# Patient Record
Sex: Female | Born: 1990 | Race: Black or African American | Hispanic: No | Marital: Single | Smoking: Never smoker
Health system: Southern US, Community
[De-identification: ages and names within clinical notes are randomized; demographics above are authoritative.]

## PROBLEM LIST (undated history)

## (undated) DIAGNOSIS — O00109 Unspecified tubal pregnancy without intrauterine pregnancy: Secondary | ICD-10-CM

## (undated) HISTORY — PX: DILATION AND CURETTAGE OF UTERUS: SHX78

## (undated) HISTORY — PX: SALPINGECTOMY: SHX328

---

## 2011-09-16 ENCOUNTER — Emergency Department (HOSPITAL_COMMUNITY)
Admission: EM | Admit: 2011-09-16 | Discharge: 2011-09-16 | Disposition: A | Discharge status: Home / Self Care | Payer: 59 | Attending: Emergency Medicine | Admitting: Emergency Medicine

## 2011-09-16 DIAGNOSIS — K089 Disorder of teeth and supporting structures, unspecified: Secondary | ICD-10-CM | POA: Insufficient documentation

## 2012-01-25 ENCOUNTER — Emergency Department (HOSPITAL_COMMUNITY): Payer: Medicaid Other

## 2012-01-25 ENCOUNTER — Other Ambulatory Visit (HOSPITAL_COMMUNITY): Payer: PRIVATE HEALTH INSURANCE

## 2012-01-25 ENCOUNTER — Observation Stay (HOSPITAL_COMMUNITY)
Admission: EM | Admit: 2012-01-25 | Discharge: 2012-01-27 | Disposition: A | Discharge status: Home / Self Care | Payer: Medicaid Other | Attending: Obstetrics and Gynecology | Admitting: Obstetrics and Gynecology

## 2012-01-25 ENCOUNTER — Encounter (HOSPITAL_COMMUNITY): Payer: Self-pay | Admitting: Emergency Medicine

## 2012-01-25 DIAGNOSIS — R1031 Right lower quadrant pain: Secondary | ICD-10-CM | POA: Insufficient documentation

## 2012-01-25 DIAGNOSIS — O00109 Unspecified tubal pregnancy without intrauterine pregnancy: Principal | ICD-10-CM | POA: Insufficient documentation

## 2012-01-25 LAB — ABO/RH: ABO/RH(D): O POS

## 2012-01-25 LAB — URINE MICROSCOPIC-ADD ON

## 2012-01-25 LAB — CBC
HCT: 34.7 % — ABNORMAL LOW (ref 36.0–46.0)
MCH: 30.2 pg (ref 26.0–34.0)
MCHC: 32.9 g/dL (ref 30.0–36.0)
MCV: 92 fL (ref 78.0–100.0)
RDW: 12.1 % (ref 11.5–15.5)

## 2012-01-25 LAB — POCT I-STAT, CHEM 8
BUN: 5 mg/dL — ABNORMAL LOW (ref 6–23)
Calcium, Ion: 1.1 mmol/L — ABNORMAL LOW (ref 1.12–1.32)
Creatinine, Ser: 0.8 mg/dL (ref 0.50–1.10)
Hemoglobin: 15 g/dL (ref 12.0–15.0)
Sodium: 139 mEq/L (ref 135–145)
TCO2: 23 mmol/L (ref 0–100)

## 2012-01-25 LAB — DIFFERENTIAL
Basophils Absolute: 0 10*3/uL (ref 0.0–0.1)
Basophils Relative: 0 % (ref 0–1)
Eosinophils Absolute: 0 10*3/uL (ref 0.0–0.7)
Eosinophils Relative: 0 % (ref 0–5)
Monocytes Absolute: 0.8 10*3/uL (ref 0.1–1.0)
Monocytes Relative: 7 % (ref 3–12)

## 2012-01-25 LAB — WET PREP, GENITAL

## 2012-01-25 LAB — URINALYSIS, ROUTINE W REFLEX MICROSCOPIC
Bilirubin Urine: NEGATIVE
Ketones, ur: 40 mg/dL — AB
Nitrite: NEGATIVE
Urobilinogen, UA: 1 mg/dL (ref 0.0–1.0)

## 2012-01-25 MED ORDER — ONDANSETRON HCL 4 MG/2ML IJ SOLN
4.0000 mg | Freq: Once | INTRAMUSCULAR | Status: AC
  Administered 2012-01-25: 4 mg via INTRAVENOUS
  Filled 2012-01-25: qty 2

## 2012-01-25 MED ORDER — SODIUM CHLORIDE 0.9 % IV SOLN
INTRAVENOUS | Status: DC
  Administered 2012-01-25: 23:00:00 via INTRAVENOUS

## 2012-01-25 MED ORDER — SODIUM CHLORIDE 0.9 % IV BOLUS (SEPSIS)
500.0000 mL | Freq: Once | INTRAVENOUS | Status: AC
  Administered 2012-01-25: 500 mL via INTRAVENOUS

## 2012-01-25 MED ORDER — MORPHINE SULFATE 4 MG/ML IJ SOLN
4.0000 mg | Freq: Once | INTRAMUSCULAR | Status: AC
  Administered 2012-01-25: 4 mg via INTRAVENOUS
  Filled 2012-01-25: qty 1

## 2012-01-25 NOTE — ED Provider Notes (Signed)
History     CSN: [id number]  Arrival date & time 01/25/12  1850   First MD Initiated Contact with Patient 01/25/12 1917      Chief Complaint  Patient presents with  . Abdominal Pain    (Consider location/radiation/quality/duration/timing/severity/associated sxs/prior treatment) Patient is a 21 y.o. female presenting with abdominal pain. The history is provided by the patient.  Abdominal Pain The primary symptoms of the illness include abdominal pain and vaginal bleeding. The primary symptoms of the illness do not include fatigue or shortness of breath.  Symptoms associated with the illness do not include back pain.   patient states that earlier in the month she had her condom break and took Plan B. She then began her period on the 16th when she was 3. It was initially burgendy then become more red. She's now having increasing abdominal pain and cramping. She states that she usually has cramping with her periods, nonpayment this.  History reviewed. No pertinent past medical history.  History reviewed. No pertinent past surgical history.  History reviewed. No pertinent family history.  History  Substance Use Topics  . Smoking status: Never Smoker   . Smokeless tobacco: Not on file  . Alcohol Use: No    OB History    Grav Para Term Preterm Abortions TAB SAB Ect Mult Living                  Review of Systems  Constitutional: Negative for fatigue.  Eyes: Negative for pain.  Respiratory: Negative for shortness of breath.   Cardiovascular: Negative for chest pain.  Gastrointestinal: Positive for abdominal pain. Negative for anal bleeding.  Genitourinary: Positive for vaginal bleeding and menstrual problem.  Musculoskeletal: Negative for back pain.  Skin: Negative for rash.  Neurological: Negative for syncope.  Psychiatric/Behavioral: Negative for confusion.    Allergies  Review of patient's allergies indicates no known allergies.  Home Medications   Current  Outpatient Rx  Name Route Sig Dispense Refill  . IBUPROFEN 200 MG PO TABS Oral Take 400 mg by mouth every 6 (six) hours as needed. Pain      BP 98/58  Pulse 77  Temp(Src) 98.7 F (37.1 C) (Oral)  Resp 15  SpO2 100%  LMP 01/13/2012  Physical Exam  Vitals reviewed. Constitutional: She appears well-developed.  Eyes: Pupils are equal, round, and reactive to light.  Cardiovascular: Normal rate.   Pulmonary/Chest: Effort normal.  Abdominal: There is tenderness.       Suprapubic and right lower quadrant tenderness without rebound or guarding  Skin: Skin is warm and dry.    ED Course  Procedures (including critical care time)  Labs Reviewed  CBC - Abnormal; Notable for the following:    WBC 11.1 (*)    RBC 3.77 (*)    Hemoglobin 11.4 (*)    HCT 34.7 (*)    All other components within normal limits  DIFFERENTIAL - Abnormal; Notable for the following:    Neutro Abs 8.2 (*)    All other components within normal limits  URINALYSIS, ROUTINE W REFLEX MICROSCOPIC - Abnormal; Notable for the following:    Color, Urine AMBER (*) BIOCHEMICALS MAY BE AFFECTED BY COLOR   APPearance TURBID (*)    Specific Gravity, Urine 1.034 (*)    Hgb urine dipstick LARGE (*)    Ketones, ur 40 (*)    Protein, ur 30 (*)    Leukocytes, UA TRACE (*)    All other components within normal limits  PREGNANCY,  URINE - Abnormal; Notable for the following:    Preg Test, Ur POSITIVE (*)    All other components within normal limits  WET PREP, GENITAL - Abnormal; Notable for the following:    WBC, Wet Prep HPF POC FEW (*)    All other components within normal limits  POCT I-STAT, CHEM 8 - Abnormal; Notable for the following:    Potassium 3.1 (*)    BUN 5 (*)    Calcium, Ion 1.10 (*)    All other components within normal limits  URINE MICROSCOPIC-ADD ON - Abnormal; Notable for the following:    Squamous Epithelial / LPF FEW (*)    Bacteria, UA MANY (*)    All other components within normal limits  HCG,  QUANTITATIVE, PREGNANCY - Abnormal; Notable for the following:    hCG, Beta Chain, Quant, S 456 (*)    All other components within normal limits  ABO/RH  GC/CHLAMYDIA PROBE AMP, GENITAL  RPR  HIV ANTIBODY (ROUTINE TESTING)   US Ob Comp Less 14 Wks  01/25/2012  *RADIOLOGY REPORT*  Clinical Data: Pregnant, bleeding since 01/13/2012, question ectopic pregnancy; quantitative beta HCG 456  OBSTETRIC <14 WK [place] AND TRANSVAGINAL OB US  Technique:  Both transabdominal and transvaginal ultrasound examinations were performed for complete evaluation of the gestation as well as the maternal uterus, adnexal regions, and pelvic cul-de-sac.  Transvaginal technique was performed to assess early pregnancy.  Comparison:  None  Intrauterine gestational sac:  Not identified Yolk sac: N/A Embryo: N/A Cardiac Activity: N/A Heart Rate: N/A bpm  Maternal uterus/adnexae: Uterus normal in size and morphology. Left ovary normal size and morphology, 3.5 x 1.7 x 1.9 cm. Right ovary questionably measures 4.7 x 2.0 x 2.5 cm. Superior to the right ovary a heterogeneous collection of isoechoic to hypoechoic material is identified, approximately 6.1 cm greatest size. Internal blood flow is seen at a small portion of this region on color Doppler imaging. Moderate to large amount of complex free pelvic fluid suspect blood. No other pelvic masses identified. No endometrial fluid seen.  IMPRESSION: No intrauterine pregnancy identified, though an intrauterine gestation would not be expected to be visualized at a quantitative beta HCG of 456. Large amount of complex free pelvic fluid/blood. The typical adnexal ring lesion of ectopic pregnancy is not discretely visualized. Abnormal material identified superior to the right ovary 6 cm in size. This could represent acute hemorrhage/clot, either from an obscured ruptured ectopic pregnancy or other pathology such as a ruptured ovarian cyst. Findings are highly suspicious for but not diagnostic of a  ruptured ectopic pregnancy.  Critical Value/emergent results were called by telephone at the time of interpretation on 01/25/2012  at 2302 hours  to  Dr. Bebe Shaggy, who verbally acknowledged these results.  Original Report Authenticated By: Lollie Marrow, M.D.   US Ob Transvaginal  01/25/2012  *RADIOLOGY REPORT*  Clinical Data: Pregnant, bleeding since 01/13/2012, question ectopic pregnancy; quantitative beta HCG 456  OBSTETRIC <14 WK [place] AND TRANSVAGINAL OB US  Technique:  Both transabdominal and transvaginal ultrasound examinations were performed for complete evaluation of the gestation as well as the maternal uterus, adnexal regions, and pelvic cul-de-sac.  Transvaginal technique was performed to assess early pregnancy.  Comparison:  None  Intrauterine gestational sac:  Not identified Yolk sac: N/A Embryo: N/A Cardiac Activity: N/A Heart Rate: N/A bpm  Maternal uterus/adnexae: Uterus normal in size and morphology. Left ovary normal size and morphology, 3.5 x 1.7 x 1.9 cm. Right ovary questionably measures  4.7 x 2.0 x 2.5 cm. Superior to the right ovary a heterogeneous collection of isoechoic to hypoechoic material is identified, approximately 6.1 cm greatest size. Internal blood flow is seen at a small portion of this region on color Doppler imaging. Moderate to large amount of complex free pelvic fluid suspect blood. No other pelvic masses identified. No endometrial fluid seen.  IMPRESSION: No intrauterine pregnancy identified, though an intrauterine gestation would not be expected to be visualized at a quantitative beta HCG of 456. Large amount of complex free pelvic fluid/blood. The typical adnexal ring lesion of ectopic pregnancy is not discretely visualized. Abnormal material identified superior to the right ovary 6 cm in size. This could represent acute hemorrhage/clot, either from an obscured ruptured ectopic pregnancy or other pathology such as a ruptured ovarian cyst. Findings are highly suspicious for  but not diagnostic of a ruptured ectopic pregnancy.  Critical Value/emergent results were called by telephone at the time of interpretation on 01/25/2012  at 2302 hours  to  Dr. Bebe Shaggy, who verbally acknowledged these results.  Original Report Authenticated By: Lollie Marrow, M.D.     1. Ectopic pregnancy       MDM  Moderate right lower quadrant tenderness. Will positive urine pregnancy test with a Quant of 430. Large amount of free fluid on ultrasound. She's a possible ectopic pregnancy. I discussed with Dr. Billy Coast. She'll be transferred to [facility] hospital for admission and observation        Juliet Rude. Rubin Payor, MD 01/26/12 [phone]

## 2012-01-25 NOTE — ED Notes (Signed)
MD at bedside. 

## 2012-01-25 NOTE — ED Notes (Signed)
Patient returned from US.

## 2012-01-25 NOTE — ED Notes (Signed)
Patient transported to US 

## 2012-01-25 NOTE — ED Notes (Signed)
Pt states she got her period on the 16th and it was burgendy in color and then it became very red  Pt states she took a plan B pill unknown what day she took it and now is having extreme abd cramping and pain and continues to have bleeding

## 2012-01-26 ENCOUNTER — Observation Stay (HOSPITAL_COMMUNITY): Payer: Medicaid Other

## 2012-01-26 LAB — HIV ANTIBODY (ROUTINE TESTING W REFLEX): HIV: NONREACTIVE

## 2012-01-26 LAB — CBC
MCH: 31.4 pg (ref 26.0–34.0)
MCHC: 33.3 g/dL (ref 30.0–36.0)
MCHC: 33.2 g/dL (ref 30.0–36.0)
Platelets: 204 10*3/uL (ref 150–400)
RDW: 12.5 % (ref 11.5–15.5)

## 2012-01-26 LAB — GC/CHLAMYDIA PROBE AMP, GENITAL
Chlamydia, DNA Probe: NEGATIVE
GC Probe Amp, Genital: NEGATIVE

## 2012-01-26 LAB — HCG, QUANTITATIVE, PREGNANCY: hCG, Beta Chain, Quant, S: 657 m[IU]/mL — ABNORMAL HIGH (ref <5)

## 2012-01-26 MED ORDER — DEXTROSE-NACL 5-0.45 % IV SOLN
INTRAVENOUS | Status: DC
  Administered 2012-01-26 – 2012-01-27 (×4): via INTRAVENOUS

## 2012-01-26 MED ORDER — MORPHINE SULFATE 4 MG/ML IJ SOLN
4.0000 mg | INTRAMUSCULAR | Status: DC | PRN
  Administered 2012-01-27 (×2): 4 mg via INTRAVENOUS
  Filled 2012-01-26 (×2): qty 1

## 2012-01-26 MED ORDER — ONDANSETRON 8 MG/NS 50 ML IVPB
8.0000 mg | Freq: Three times a day (TID) | INTRAVENOUS | Status: DC | PRN
  Administered 2012-01-27 (×2): 8 mg via INTRAVENOUS
  Filled 2012-01-26 (×3): qty 8

## 2012-01-26 NOTE — Progress Notes (Signed)
   Subjective: Patient reports marked improvement in pain and wants to eat. Minimal spotting. No nausea or vomiting. Had one dose Morphine at 8pm and no analgesia since. No history of PID or ectopic.  Objective: I have reviewed patient's vital signs, medications, labs and radiology results.  General: alert, cooperative and appears stated age Resp: clear to auscultation bilaterally and normal percussion bilaterally Cardio: regular rate and rhythm, S1, S2 normal, no murmur, click, rub or gallop GI: soft, non-tender; bowel sounds normal; no masses,  no organomegaly and No rebound , no guarding. Slight tenderness in RLQ Extremities: extremities normal, atraumatic, no cyanosis or edema Vaginal Bleeding: minimal Neuro: nonfocal  Assessment: RLQ  Pain now resolving. ? Free fluid in pelvis of ?Etx. Clinically, not consistent with ruptured ectopic. Hemodynamically stable.  Plan: NPO Will ck quant and rpt sono. Rpt CBC in 4 hours.  LOS: 1 day    Quintana Canelo J 01/26/2012, 8:46 AM

## 2012-01-26 NOTE — H&P (Signed)
NAMEKARREN, Jessica Gutierrez          ACCOUNT NO.:  [id number]  MEDICAL RECORD NO.:  [id number]  LOCATION:  9306                          FACILITY:  [facility]  PHYSICIAN:  Lenoard Aden, M.D.DATE OF BIRTH:  1991/05/13  DATE OF ADMISSION:  01/25/2012 DATE OF DISCHARGE:                             HISTORY & PHYSICAL   CHIEF COMPLAINT:  Right lower quadrant pain x2 days.  HISTORY OF PRESENT ILLNESS:  The patient is a 21 year old [place] American female, G1, P0, LMP normal on December 13, 2011.  The patient had intercourse with a condom breakage on December 20, 2011 followed by plan B on December 21, 2011.  Subsequent to that, she had the beginning of some bleeding and cramping on January 13, 2012, which has continued since the cramping increased on Sunday and she presented to the emergency room at [facility] last night and was evaluated by Dr. Rubin Payor.  Evaluation included a hemodynamically stable patient with right lower quadrant discomfort and an ultrasound suggesting a 6-cm mass/fluid collection in the right adnexa, which was suspicious for free fluid, but not pneumonic of an ectopic pregnancy. There was no intrauterine pregnancy identified.  Hemoglobin was 15 and quantitative HCG was 456.  The patient was transferred to [facility] where she had received one dose of morphine at 8 p.m. upon presenting to [facility].  She reported that her pain had markedly improved and was 3.  Clinical exam at that time reveals no evidence of acute abdomen.  No rebound.  No guarding.  PAST MEDICAL HISTORY:  Noncontributory.  SOCIAL HISTORY:  Noncontributory.  FAMILY HISTORY:  Noncontributory.  The patient has no history of PID or ectopic.  PHYSICAL EXAMINATION:  GENERAL:  The patient is a well-developed, well- nourished, thin African American female, in no distress. VITAL SIGNS:  Stable.  The patient is afebrile. LUNGS:  Clear to auscultation. HEART:  Regular  rhythm. ABDOMEN:  Soft.  Normal bowel sounds x4 quadrants.  Scaphoid with some mild tenderness to deep palpation in the right lower quadrant.  No rebound and minimal guarding noted. PELVIC:  Deferred. EXTREMITIES:  There are no cords. NEUROLOGIC:  Nonfocal. SKIN:  Intact. GU:  Minimal vaginal bleeding was noted.  Ultrasound was reported on January 25, 2012 from [facility] reveals a negative intrauterine pregnancy, a normal left ovary and a normal right ovary.  Superior to the right ovary, there was reported heterogeneous collection of isoechoic hypoechoic material that was approximately 6 cm in its greatest dimension.  There is a fair amount of complex free fluid reported, which is suspected for blood.  Hemoglobin upon presentation and dehydrated at [facility] was initially 15 and this morning 10.1.  IMPRESSION:  Right lower quadrant pain with positive pregnancy test and suspicious fluid collection in the right adnexa.  The patient at this time is clinically and hemodynamically stable with no pain, stable vital signs, and a nonsurgical abdomen.  PLAN:  At this time will be to check her quantitative HCG that is pending.  Repeat her CBC in 4 hours and repeat a pelvic ultrasound today.     Lenoard Aden, M.D.     RJT/MEDQ  D:  01/26/2012  T:  01/26/2012  Job:  [id number]

## 2012-01-26 NOTE — Progress Notes (Signed)
UR Chart review completed.  

## 2012-01-26 NOTE — Progress Notes (Signed)
Subjective: Patient reports no problems voiding.  Pt is hungry. No increase in pain. Wants to go home.  Objective: I have reviewed patient's vital signs, medications, labs and radiology results.  General: alert, cooperative and appears stated age Resp: clear to auscultation bilaterally and normal percussion bilaterally Cardio: regular rate and rhythm, S1, S2 normal, no murmur, click, rub or gallop GI: soft, No rebound, slight guarding in right lower quadrant Extremities: extremities normal, atraumatic, no cyanosis or edema Vaginal Bleeding: minimal   Assessment/Plan: RLQ pain- sono  Today consistent clot in right adnexa. Ectopic vs Tubal abortion. Pain improved. Hgb stable.  Will rpt quant and CBC in am. If rising quant noted will proceed with Laparoscopy. Plan discussed with patient and mother today.  LOS: 1 day    Candi Profit J 01/26/2012, 4:13 PM

## 2012-01-27 ENCOUNTER — Encounter (HOSPITAL_COMMUNITY)
Admission: EM | Disposition: A | Discharge status: Home / Self Care | Payer: Self-pay | Source: Home / Self Care | Attending: Obstetrics and Gynecology

## 2012-01-27 ENCOUNTER — Encounter (HOSPITAL_COMMUNITY): Payer: Self-pay | Admitting: Anesthesiology

## 2012-01-27 ENCOUNTER — Ambulatory Visit: Admit: 2012-01-27 | Payer: Self-pay | Admitting: Obstetrics & Gynecology

## 2012-01-27 ENCOUNTER — Other Ambulatory Visit: Payer: Self-pay | Admitting: Obstetrics & Gynecology

## 2012-01-27 ENCOUNTER — Inpatient Hospital Stay (HOSPITAL_COMMUNITY): Payer: Medicaid Other | Admitting: Anesthesiology

## 2012-01-27 HISTORY — PX: LAPAROSCOPY: SHX197

## 2012-01-27 HISTORY — PX: DILATION AND CURETTAGE OF UTERUS: SHX78

## 2012-01-27 LAB — CBC
MCH: 30.9 pg (ref 26.0–34.0)
Platelets: 208 10*3/uL (ref 150–400)
RBC: 3.4 MIL/uL — ABNORMAL LOW (ref 3.87–5.11)
RDW: 12.2 % (ref 11.5–15.5)
WBC: 8.5 10*3/uL (ref 4.0–10.5)

## 2012-01-27 LAB — HCG, QUANTITATIVE, PREGNANCY: hCG, Beta Chain, Quant, S: 883 m[IU]/mL — ABNORMAL HIGH (ref <5)

## 2012-01-27 SURGERY — DILATION AND CURETTAGE
Anesthesia: General | Laterality: Right

## 2012-01-27 MED ORDER — KETOROLAC TROMETHAMINE 30 MG/ML IJ SOLN: 15.0000 mg | Freq: Once | INTRAMUSCULAR | Status: DC | PRN

## 2012-01-27 MED ORDER — CHLOROPROCAINE HCL 1 % IJ SOLN
INTRAMUSCULAR | Status: AC
  Filled 2012-01-27: qty 30

## 2012-01-27 MED ORDER — LIDOCAINE HCL (CARDIAC) 20 MG/ML IV SOLN
INTRAVENOUS | Status: AC
  Filled 2012-01-27: qty 5

## 2012-01-27 MED ORDER — PROMETHAZINE HCL 25 MG/ML IJ SOLN: 6.2500 mg | INTRAMUSCULAR | Status: DC | PRN

## 2012-01-27 MED ORDER — FENTANYL CITRATE 0.05 MG/ML IJ SOLN
INTRAMUSCULAR | Status: AC
  Filled 2012-01-27: qty 2

## 2012-01-27 MED ORDER — PROPOFOL 10 MG/ML IV EMUL
INTRAVENOUS | Status: AC
  Filled 2012-01-27: qty 20

## 2012-01-27 MED ORDER — LACTATED RINGERS IR SOLN
Status: DC | PRN
  Administered 2012-01-27: 1

## 2012-01-27 MED ORDER — BUPIVACAINE HCL (PF) 0.25 % IJ SOLN
INTRAMUSCULAR | Status: AC
  Filled 2012-01-27: qty 30

## 2012-01-27 MED ORDER — GLYCOPYRROLATE 0.2 MG/ML IJ SOLN
INTRAMUSCULAR | Status: DC | PRN
  Administered 2012-01-27: .4 mg via INTRAVENOUS

## 2012-01-27 MED ORDER — FENTANYL CITRATE 0.05 MG/ML IJ SOLN
INTRAMUSCULAR | Status: DC | PRN
  Administered 2012-01-27 (×4): 50 ug via INTRAVENOUS

## 2012-01-27 MED ORDER — DEXAMETHASONE SODIUM PHOSPHATE 10 MG/ML IJ SOLN
INTRAMUSCULAR | Status: AC
  Filled 2012-01-27: qty 1

## 2012-01-27 MED ORDER — ROCURONIUM BROMIDE 50 MG/5ML IV SOLN
INTRAVENOUS | Status: AC
  Filled 2012-01-27: qty 1

## 2012-01-27 MED ORDER — SUCCINYLCHOLINE CHLORIDE 20 MG/ML IJ SOLN
INTRAMUSCULAR | Status: AC
  Filled 2012-01-27: qty 10

## 2012-01-27 MED ORDER — LACTATED RINGERS IV SOLN
INTRAVENOUS | Status: DC | PRN
  Administered 2012-01-27 (×3): via INTRAVENOUS

## 2012-01-27 MED ORDER — LIDOCAINE HCL (CARDIAC) 20 MG/ML IV SOLN
INTRAVENOUS | Status: DC | PRN
  Administered 2012-01-27: 20 mg via INTRAVENOUS
  Administered 2012-01-27: 40 mg via INTRAVENOUS

## 2012-01-27 MED ORDER — PROPOFOL 10 MG/ML IV EMUL
INTRAVENOUS | Status: DC | PRN
  Administered 2012-01-27: 150 mg via INTRAVENOUS
  Administered 2012-01-27: 50 mg via INTRAVENOUS

## 2012-01-27 MED ORDER — KETOROLAC TROMETHAMINE 30 MG/ML IJ SOLN
INTRAMUSCULAR | Status: DC | PRN
  Administered 2012-01-27: 30 mg via INTRAVENOUS

## 2012-01-27 MED ORDER — CHLOROPROCAINE HCL 1 % IJ SOLN
INTRAMUSCULAR | Status: DC | PRN
  Administered 2012-01-27: 10 mL

## 2012-01-27 MED ORDER — ONDANSETRON HCL 4 MG/2ML IJ SOLN
INTRAMUSCULAR | Status: AC
  Filled 2012-01-27: qty 2

## 2012-01-27 MED ORDER — OXYCODONE-ACETAMINOPHEN 5-325 MG PO TABS: 1.0000 | ORAL_TABLET | ORAL | Status: DC | PRN

## 2012-01-27 MED ORDER — METHYLENE BLUE 1 % INJ SOLN
INTRAMUSCULAR | Status: AC
  Filled 2012-01-27: qty 1

## 2012-01-27 MED ORDER — ACETAMINOPHEN 325 MG PO TABS: 325.0000 mg | ORAL_TABLET | ORAL | Status: DC | PRN

## 2012-01-27 MED ORDER — MIDAZOLAM HCL 2 MG/2ML IJ SOLN
INTRAMUSCULAR | Status: AC
  Filled 2012-01-27: qty 2

## 2012-01-27 MED ORDER — FENTANYL CITRATE 0.05 MG/ML IJ SOLN: 25.0000 ug | INTRAMUSCULAR | Status: DC | PRN

## 2012-01-27 MED ORDER — HYDROMORPHONE HCL PF 1 MG/ML IJ SOLN
INTRAMUSCULAR | Status: AC
  Filled 2012-01-27: qty 1

## 2012-01-27 MED ORDER — ROCURONIUM BROMIDE 100 MG/10ML IV SOLN
INTRAVENOUS | Status: DC | PRN
  Administered 2012-01-27: 20 mg via INTRAVENOUS
  Administered 2012-01-27: 5 mg via INTRAVENOUS

## 2012-01-27 MED ORDER — ONDANSETRON HCL 4 MG/2ML IJ SOLN
INTRAMUSCULAR | Status: DC | PRN
  Administered 2012-01-27 (×2): 4 mg via INTRAVENOUS

## 2012-01-27 MED ORDER — SUCCINYLCHOLINE CHLORIDE 20 MG/ML IJ SOLN
INTRAMUSCULAR | Status: DC | PRN
  Administered 2012-01-27: 100 mg via INTRAVENOUS

## 2012-01-27 MED ORDER — METOCLOPRAMIDE HCL 5 MG/ML IJ SOLN: 10.0000 mg | Freq: Once | INTRAMUSCULAR | Status: DC

## 2012-01-27 MED ORDER — MIDAZOLAM HCL 5 MG/5ML IJ SOLN
INTRAMUSCULAR | Status: DC | PRN
  Administered 2012-01-27: 2 mg via INTRAVENOUS

## 2012-01-27 MED ORDER — HYDROMORPHONE HCL PF 1 MG/ML IJ SOLN
INTRAMUSCULAR | Status: DC | PRN
  Administered 2012-01-27 (×2): 1 mg via INTRAVENOUS

## 2012-01-27 MED ORDER — DEXAMETHASONE SODIUM PHOSPHATE 10 MG/ML IJ SOLN
INTRAMUSCULAR | Status: DC | PRN
  Administered 2012-01-27: 10 mg via INTRAVENOUS

## 2012-01-27 MED ORDER — KETOROLAC TROMETHAMINE 30 MG/ML IJ SOLN
INTRAMUSCULAR | Status: AC
  Filled 2012-01-27: qty 1

## 2012-01-27 MED ORDER — NEOSTIGMINE METHYLSULFATE 1 MG/ML IJ SOLN
INTRAMUSCULAR | Status: DC | PRN
  Administered 2012-01-27: 2 mg via INTRAVENOUS

## 2012-01-27 SURGICAL SUPPLY — 37 items
CABLE HIGH FREQUENCY MONO STRZ (ELECTRODE) IMPLANT
CATH ROBINSON RED A/P 16FR (CATHETERS) ×3 IMPLANT
CHLORAPREP W/TINT 26ML (MISCELLANEOUS) ×3 IMPLANT
CLOTH BEACON ORANGE TIMEOUT ST (SAFETY) ×3 IMPLANT
CONTAINER PREFILL 10% NBF 60ML (FORM) ×3 IMPLANT
DECANTER SPIKE VIAL GLASS SM (MISCELLANEOUS) ×3 IMPLANT
DERMABOND ADVANCED (GAUZE/BANDAGES/DRESSINGS) ×1
DERMABOND ADVANCED .7 DNX12 (GAUZE/BANDAGES/DRESSINGS) ×2 IMPLANT
EVACUATOR SMOKE 8.L (FILTER) IMPLANT
FORCEPS CUTTING 33CM 5MM (CUTTING FORCEPS) ×3 IMPLANT
FORCEPS CUTTING 45CM 5MM (CUTTING FORCEPS) IMPLANT
GLOVE BIO SURGEON STRL SZ7 (GLOVE) ×6 IMPLANT
GLOVE BIOGEL PI IND STRL 7.0 (GLOVE) ×8 IMPLANT
GLOVE BIOGEL PI INDICATOR 7.0 (GLOVE) ×4
GOWN BRE IMP SLV AUR LG STRL (GOWN DISPOSABLE) ×15 IMPLANT
GOWN PREVENTION PLUS LG XLONG (DISPOSABLE) ×9 IMPLANT
MANIPULATOR UTERINE 4.5 ZUMI (MISCELLANEOUS) ×3 IMPLANT
NEEDLE INSUFFLATION 14GA 120MM (NEEDLE) ×3 IMPLANT
NEEDLE SPNL 22GX3.5 QUINCKE BK (NEEDLE) ×3 IMPLANT
NS IRRIG 1000ML POUR BTL (IV SOLUTION) ×3 IMPLANT
PACK LAPAROSCOPY BASIN (CUSTOM PROCEDURE TRAY) ×3 IMPLANT
PACK LAPAROSCOPY I 1258 (SET/KITS/TRAYS/PACK) ×3 IMPLANT
PACK VAGINAL MINOR WOMEN LF (CUSTOM PROCEDURE TRAY) ×3 IMPLANT
PAD PREP 24X48 CUFFED NSTRL (MISCELLANEOUS) ×3 IMPLANT
POUCH SPECIMEN RETRIEVAL 10MM (ENDOMECHANICALS) IMPLANT
SCISSORS LAP 5X35 DISP (ENDOMECHANICALS) IMPLANT
SET IRRIG TUBING LAPAROSCOPIC (IRRIGATION / IRRIGATOR) ×3 IMPLANT
SOLUTION ELECTROLUBE (MISCELLANEOUS) IMPLANT
SUT VICRYL 0 UR6 27IN ABS (SUTURE) ×3 IMPLANT
SUT VICRYL 4-0 PS2 18IN ABS (SUTURE) ×3 IMPLANT
SYR 20CC LL (SYRINGE) ×3 IMPLANT
TOWEL OR 17X24 6PK STRL BLUE (TOWEL DISPOSABLE) ×9 IMPLANT
TROCAR BALLN 12MMX100 BLUNT (TROCAR) IMPLANT
TROCAR XCEL NON-BLD 11X100MML (ENDOMECHANICALS) ×3 IMPLANT
TROCAR XCEL NON-BLD 5MMX100MML (ENDOMECHANICALS) ×6 IMPLANT
WARMER LAPAROSCOPE (MISCELLANEOUS) ×3 IMPLANT
WATER STERILE IRR 1000ML POUR (IV SOLUTION) ×3 IMPLANT

## 2012-01-27 NOTE — Progress Notes (Signed)
Discharged instructions reviewed with patient, verbalized understanding.  IV discontinued. VS.  Voiding without difficulty and pain 2(10).  Scant vaginal drainage.

## 2012-01-27 NOTE — Progress Notes (Signed)
Patient ID: Jessica Gutierrez, female   DOB: [birth year], 21 y.o.   MRN: [id number] Subjective:  Patient reports no problems voiding. Pt is hungry. No increase in pain.  Still Wants to go home.  Had one dose of MSO4 last night and one dose of Zofran  Objective:   I have reviewed patient's vital signs, medications, labs and radiology results.  General: alert, cooperative and appears stated age  Resp: clear to auscultation bilaterally and normal percussion bilaterally  Cardio: regular rate and rhythm, S1, S2 normal, no murmur, click, rub or gallop  GI: soft, No rebound, slight guarding in right lower quadrant  Extremities: extremities normal, atraumatic, no cyanosis or edema  Vaginal Bleeding: minimal   Hgb stable at 10.5 Quant now inc to 883  Assessment/Plan:  RLQ pain- sono Today consistent clot in right adnexa.  Ectopic vs Tubal abortion. Pain improved but persists. Hgb stable.  Rising quants with sono finding discussed with patient today. Will proceed with D&C and Diag Laparoscopy with possible right salpingostomy/salpingectomy. Risks vs benefits of procedure vs expectant management discussed. Consent done. Not MTX candidate due to possible hemo peritoneum.  Discussed with Dr. Juliene Pina who will perform procedure. Plan discussed with patient today.  LOS: 1 day

## 2012-01-27 NOTE — Progress Notes (Signed)
21 yo, G1P0, healthy young woman, failed Plan B, undesired pregnancy. In school in [place], mother in [place].  Admitted for RLQ pain and +preg test, with pregnancy of unknown location.  C/o RLQ pain again today (had resolved yesterday) with abnormal rise in quantHCG and too low to detect IUP.  No medical hx, no prior surgeries. NKDAs.  RLQ pain- sono Today consistent clot in right adnexa.  Ectopic vs Tubal abortion. Pain improved but persists. Hgb stable.  Rising quants with sono finding discussed with patient again (was counseled extensively by Dr Billy Coast as well) I have discussed findings and decision process with her.  Will proceed with D&C (frozen section) and Diag Laparoscopy with possible right salpingostomy/salpingectomy.  Risks vs benefits of procedure vs expectant management discussed. Consent done.  Not MTX candidate due to possible hemo peritoneum.

## 2012-01-27 NOTE — OR Nursing (Signed)
First procedure ended at 1435

## 2012-01-27 NOTE — Op Note (Signed)
Preoperative diagnosis: Suspected ectopic pregnancy Postoperative diagnosis: Right tubal ectopic pregnancy with hemoperitoneum Procedure: Dilatation and curettage and Laparoscopic Right salpingectomy, drainage of hemoperitoneum Surgeon: Dr Shea Evans, MD Assistants: Dr Noland Fordyce, MD Anesthesia Gen. Endotracheal IV fluids LR, 2200 cc EBL 50 cc from procedure and 400 cc hemoperitoneum noted. Urine clear 50 cc clear, foley removed before transfer to PACU Complications none Disposition PACU and home if stable Specimens none  Procedure Patient is 21 yo, G1P0, with abnormal rise in bHCG, right pelvic pain, pelvic fluid and pelvic mass noted on sonography. Patient was stable, hence watched over 24 hrs since pain had resolved. But on 01/27/12 pain started in RLQ again, hence decision was made to proceed with surgery. Diagnostic d&c to assess villi in utero with frozen section and if not present to perform laparoscopy was discussed with patient.  Risk and complications of surgery including infection, bleeding, damage to internal organs, other complications including pneumonia, VTE were reviewed. Also discussed difficulty to conceive in future with salpingectomy and risk of ectopic pregnancy in left tube as well. Patient voiced understanding. Informed written consent was obtained.  Patient was brought to the operating room with IV running. Timeout was carried out. She underwent general anesthesia without difficulty and was given dorsal lithotomy position. Examination under anesthesia revealed retroverted normal size uterus with no adnexal masses. Parts prepped and draped in standard fashion. Bladder was emptied with straight catheter with clear urine (foley was inserted once laparoscopy decision was made). Speculum was placed. Anterior lip of cervix injected with 1% nesacaine and then  Paracervical block given. Anterior lip of cervix was grasped with tenaculum, uterus was sounded to 8 cm and was  retroverted. Endometrial curetting performed after cervix was dilated upto #21 [place]. 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  Zumi manipulator was introduced in the uterine cavity and secured with balloon once decision made to perform laparoscopy. Dr Ernestina Penna came in to assist at this point.  Patient was prepped again with chloro prep and draped. 5 mm incision made at the umbilicus and Veress needle introduced carefully, intraperitoneal placement confirmed with saline drop method . Pneumoperitoneum created to 16-17 mm, patient tolerated well. 5 mm trocar introduced, hemoperitoneum noted, right tubal ectopic noted. The 5 mm trocar was switched for 10 mm and 0' scope used. Two 5 mm trocars inserted in lower abdomen (one side each) and suction/ irrigator and graspers were introduced. Trendelenburg position given. Uterus normal in size. Left tube and ovary normal. Right tube distended with ectopic pregnancy, about 6x3 cm, right ovary appeared normal. Blood clots aspirated from cul de sac and flushed from over the mass, no active bleeding noted at this point, suspect small tear on tube or leaking blood from fimbrial end. Right salpingectomy was performed due to the size of the ectopic and distortion using Gyrus desiccator with blade. Hemostasis was excellent.  Hemoperitoneum was about 400 cc and was absorbed with suction/irrigation from entire abdomen, until clear.  Both ureters normal. Both ovaries normal.  All instruments were removed, pneumoperitoneum was released. Central port fascia closed with 0-vicryl. The skin was approximated using 3-0 Vicryl in subcuticular fashion. Dermabond was applied at the incision. The ZUMI manipulator and foley removed.   All counts were correct x 2. Patient was reversed from general anesthesia extubated and brought out to the recovery room in stable condition.  Plan is to discharge home since patient desires, will  consider after few hours of observation on Gyn  floor since lives with a friend on campus.  Follow up with Dr. Juliene Pina in office in 2 weeks.

## 2012-01-27 NOTE — OR Nursing (Signed)
Second procedure started at 1445

## 2012-01-27 NOTE — Progress Notes (Signed)
Discharged home with brother and and family member

## 2012-01-27 NOTE — Transfer of Care (Signed)
Immediate Anesthesia Transfer of Care Note  Patient: Jessica Gutierrez  Procedure(s) Performed:  DILATATION AND CURETTAGE; LAPAROSCOPY OPERATIVE; LAPAROSCOPIC UNILATERAL SALPINGECTOMY  Patient Location: PACU  Anesthesia Type: General  Level of Consciousness: alert  and oriented  Airway & Oxygen Therapy: Patient Spontanous Breathing and Patient connected to nasal cannula oxygen  Post-op Assessment: Report given to PACU RN and Post -op Vital signs reviewed and stable  Post vital signs: stable  Complications: No apparent anesthesia complications

## 2012-01-27 NOTE — Anesthesia Preprocedure Evaluation (Signed)
Anesthesia Evaluation  Patient identified by MRN, date of birth, ID band Patient awake    Reviewed: Allergy & Precautions, H&P , Patient's Chart, lab work & pertinent test results, reviewed documented beta blocker date and time   History of Anesthesia Complications Negative for: history of anesthetic complications  Airway Mallampati: II TM Distance: >3 FB Neck ROM: full    Dental No notable dental hx.    Pulmonary neg pulmonary ROS,  clear to auscultation  Pulmonary exam normal       Cardiovascular Exercise Tolerance: Good neg cardio ROS regular Normal    Neuro/Psych Negative Neurological ROS  Negative Psych ROS   GI/Hepatic negative GI ROS, Neg liver ROS,   Endo/Other  Negative Endocrine ROS  Renal/GU negative Renal ROS     Musculoskeletal   Abdominal   Peds  Hematology negative hematology ROS (+)   Anesthesia Other Findings   Reproductive/Obstetrics negative OB ROS                           Anesthesia Physical Anesthesia Plan  ASA: I and Emergent  Anesthesia Plan: General ETT   Post-op Pain Management:    Induction: Rapid sequence, Cricoid pressure planned and Intravenous  Airway Management Planned: Oral ETT  Additional Equipment:   Intra-op Plan:   Post-operative Plan:   Informed Consent: I have reviewed the patients History and Physical, chart, labs and discussed the procedure including the risks, benefits and alternatives for the proposed anesthesia with the patient or authorized representative who has indicated his/her understanding and acceptance.   Dental Advisory Given  Plan Discussed with: CRNA and Surgeon  Anesthesia Plan Comments:         Anesthesia Quick Evaluation

## 2012-01-27 NOTE — Anesthesia Postprocedure Evaluation (Signed)
Anesthesia Post Note  Patient: Jessica Gutierrez  Procedure(s) Performed:  DILATATION AND CURETTAGE; LAPAROSCOPY OPERATIVE; LAPAROSCOPIC UNILATERAL SALPINGECTOMY  Anesthesia type: General  Patient location: PACU  Post pain: Pain level controlled  Post assessment: Post-op Vital signs reviewed  Last Vitals:  Filed Vitals:   01/27/12 1645  BP: 91/57  Pulse: 63  Temp:   Resp: 12    Post vital signs: Reviewed  Level of consciousness: sedated  Complications: No apparent anesthesia complications

## 2012-01-28 ENCOUNTER — Encounter (HOSPITAL_COMMUNITY): Payer: Self-pay

## 2012-01-28 ENCOUNTER — Inpatient Hospital Stay (HOSPITAL_COMMUNITY)
Admission: AD | Admit: 2012-01-28 | Discharge: 2012-01-28 | Disposition: A | Discharge status: Home / Self Care | Payer: 59 | Source: Ambulatory Visit | Attending: Obstetrics & Gynecology | Admitting: Obstetrics & Gynecology

## 2012-01-28 DIAGNOSIS — R109 Unspecified abdominal pain: Secondary | ICD-10-CM | POA: Insufficient documentation

## 2012-01-28 DIAGNOSIS — R0609 Other forms of dyspnea: Secondary | ICD-10-CM | POA: Insufficient documentation

## 2012-01-28 DIAGNOSIS — G8918 Other acute postprocedural pain: Secondary | ICD-10-CM

## 2012-01-28 DIAGNOSIS — R0989 Other specified symptoms and signs involving the circulatory and respiratory systems: Secondary | ICD-10-CM | POA: Insufficient documentation

## 2012-01-28 HISTORY — DX: Unspecified tubal pregnancy without intrauterine pregnancy: O00.109

## 2012-01-28 LAB — CBC
HCT: 34.5 % — ABNORMAL LOW (ref 36.0–46.0)
Hemoglobin: 11.5 g/dL — ABNORMAL LOW (ref 12.0–15.0)
RBC: 3.71 MIL/uL — ABNORMAL LOW (ref 3.87–5.11)

## 2012-01-28 MED ORDER — KETOROLAC TROMETHAMINE 30 MG/ML IJ SOLN
30.0000 mg | Freq: Once | INTRAMUSCULAR | Status: AC
  Administered 2012-01-28: 30 mg via INTRAMUSCULAR
  Filled 2012-01-28: qty 1

## 2012-01-28 NOTE — ED Provider Notes (Signed)
Reviewed, d/w D.Helayne [place], agree with mngmt plan.

## 2012-01-28 NOTE — ED Notes (Signed)
Lab here for blood draw. Pt on phone.

## 2012-01-28 NOTE — Progress Notes (Signed)
Dr notified of patient, vs, and c/o of sudden onset of abdominal pain and shortness of breath. Patient states that she was d/c from hospital yesterday after surgery.

## 2012-01-28 NOTE — Discharge Summary (Signed)
Physician Discharge Summary  Patient ID: Jessica Gutierrez MRN: [id number] DOB/AGE: 03-Sep-1991 20 y.o.  Admit date: 01/25/2012 Discharge date: 1/30/201 Admitting MD: Dr Olivia Mackie Discharge MD: Dr Shea Evans Admission Diagnoses: Right lower quadrant pain, suspected ectopic pregnancy Discharge Diagnoses: Ruptured right tubal ectopic pregnancy.                                          S/p D&C, Laparoscopic right salpingectomy on 01/27/12. Discharged Condition: Improved, stable.   Hospital Course: 21 yo, G1 with known gestational age, pregnancy following condom failure and s/p Plan B which failed as well. Admitted for evaluation of possible ectopic pregnancy. Pain has resolved, hemoglobin and vital signs remained stable. However, bHCG rise was abnormal and sonography noted small amount of fluid collection in right adnexa hence patient was not a candidate for methotrexate therapy for ectopic. bHCG rose on 1/30 am, even though patient was stable and denied significant worsening of pain, she was counseled and agreed to proceed with surgery.\   D&C revealed absence of chorionic villi on frozen section, hence diagnosis was ectopic pregnancy, we proceeded with laparoscopy (performed by Dr Hilary Hertz, assisted by Dr Kirtland Bouchard. Fogleman). Hemoperitoneum was noted, with clot in right adnexa with tubal ectopic pregnancy. Right salpingectomy performed due to the large size of the ectopic. Surgery was uncomplicated.   Post-op recovery was uneventful, pain was well controlled and she desired early discharge. Patient has room mate and her brother will bring her back to her room.   Discharge Exam: Blood pressure 98/59, pulse 81, temperature 98.7 F (37.1 C), temperature source Oral, resp. rate 16, height 4\' 11"  (1.499 m), weight 58.968 kg (130 lb), last menstrual period 01/13/2012, SpO2 100.00%.  Disposition: Home or Self Care, post op care and pain management reviewed.   Medications: Ibuprofen, Percocet.    Discharge Orders    Future Orders Please Complete By Expires   Diet - low sodium heart healthy      Increase activity slowly      No dressing needed      Call MD for:  temperature >100.4      Call MD for:  persistant nausea and vomiting      Call MD for:  redness, tenderness, or signs of infection (pain, swelling, redness, odor or green/yellow discharge around incision site)      Call MD for:  severe uncontrolled pain      Call MD for:  difficulty breathing, headache or visual disturbances      Call MD for:  extreme fatigue      Call MD for:  persistant dizziness or light-headedness      Call MD for:  hives      Driving Restrictions      Comments:   2 wks   Lifting restrictions      Comments:   4 wks   Sexual Activity Restrictions      Comments:   4 wks     Follow-up Information    Follow up with Robley Fries, MD. (call [phone])    Contact information:   [address] [place] [address] [phone]         Signed: Robley Fries 01/28/2012, 7:24 PM

## 2012-01-28 NOTE — ED Provider Notes (Signed)
Reviewed, agree 

## 2012-01-28 NOTE — ED Provider Notes (Signed)
Jessica Gutierrez is a 21 y.o. female who presents to [facility]U via EMS for pain s/p surgery yesterday. 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 She is a patient of Field se[facility]ologist. Dr. Juliene Pina is in L&D and will be here as soon as possible. The patient c/o pain in the right lower quadrant of her abdomen. Had surgery yesterday after she had a positive pregnancy test and  right lower quad pain with no IUP. Today hurts to take a deep breath. On exam the patient is alert and oriented, normal vital signs. Lungs are clear. Abdomen soft, tender over area of diagnostic lap. Medical screening exam completed and patient is stable to await Dr. Juliene Pina.  Was given Toradol for pain and is feeling some better now.  [place], [place] 01/28/12 1122

## 2012-01-28 NOTE — ED Provider Notes (Signed)
History   C/O abdominal pain and difficulty taking deep breaths d/t sharp pain, s/p D&E and lap R salpingectomy yesterday for ectopic pregnancy w/ hemoperitoneum. Has not taken her pain medication today since she was told to take with food and did not feel hungry. Pain improved after Toradol injection x 2. Denies nausea / vomiting, minimal PO intake since discharge home yesterday, minimal flatus.   Chief Complaint  Patient presents with  . Abdominal Pain  . Shortness of Breath       Past Medical History  Diagnosis Date  . Ectopic pregnancy, tubal     Past Surgical History  Procedure Date  . Salpingectomy     right ectopic pregancy 01/26/2011  . Dilation and curettage of uterus   . Dilation and curettage of uterus 01/27/2012    Procedure: DILATATION AND CURETTAGE;  Surgeon: Robley Fries, MD;  Location: [facility];  Service: Gynecology;  Laterality: N/A;  . Laparoscopy 01/27/2012    Procedure: LAPAROSCOPY OPERATIVE;  Surgeon: Robley Fries, MD;  Location: [facility];  Service: Gynecology;  Laterality: Right;    History reviewed. No pertinent family history.  History  Substance Use Topics  . Smoking status: Never Smoker   . Smokeless tobacco: Not on file  . Alcohol Use: No    Allergies: No Known Allergies  Prescriptions prior to admission  Medication Sig Dispense Refill  . ibuprofen (ADVIL,MOTRIN) 200 MG tablet Take 400 mg by mouth every 6 (six) hours as needed. Pain        ROS: Negative except as noted. Physical Exam   Blood pressure 101/73, pulse 72, temperature 98.2 F (36.8 C), temperature source Oral, resp. rate 20, last menstrual period 01/13/2012, SpO2 96.00%.  Gen: AAO x#, NAD, resting in bed CV: RRR Pulm: CTAB Abd: soft, mild tenderness to palp, no masses. (+) incisions from Lap sx, skin incision and Dermabond application intact. Mild erythema around umbilicus from scope manipulation. No swelling / drainage at incision sites.  GU: deferred, no bleeding Ext: no  edema, neg Homan's  Results for orders placed during the hospital encounter of 01/28/12 (from the past 24 hour(s))  CBC     Status: Abnormal   Collection Time   01/28/12 11:57 AM      Component Value Range   WBC 15.1 (*) 4.0 - 10.5 (K/uL)   RBC 3.71 (*) 3.87 - 5.11 (MIL/uL)   Hemoglobin 11.5 (*) 12.0 - 15.0 (g/dL)   HCT 29.5 (*) 62.1 - 46.0 (%)   MCV 93.0  78.0 - 100.0 (fL)   MCH 31.0  26.0 - 34.0 (pg)   MCHC 33.3  30.0 - 36.0 (g/dL)   RDW 30.8  65.7 - 84.6 (%)   Platelets 241  150 - 400 (K/uL)    ED Course   Post-op pain No evidence of hemorrhage or infection. Slight increase in WBC but no fever, appropriate post-op Patient took Percocet 2 tabs from own Rx with improved pain (3/10) after 1 hour. Was able to tolerate PO fluids and crackers.  D/C home with thorough instructions to take main meds - Ibuprofen 600 mg PO Q 6 hours x 48 hours then PRN, and Percocet 1 tab PO Q 4 hrs as needed. Ambulate x 15 min  q 2 hours while awake and increase intake of warm fluids for increased gut motility. Eat frequent small meals.   F/U as scheduled in 2 wks w/ Dr. Juliene Pina for post-op eval.   Patient's narcotic bottle spilled accidentally while in MAU,  pills discarded per protocol and new Rx given to patient to refill, pharmacy notified.      Emeri Estill 01/28/2012 1:37 PM

## 2012-01-28 NOTE — Progress Notes (Signed)
Patient is brought in by ems with c/o of sudden onset of rlq pain and shortness of pain. She denies any vaginal bleeding and as not taken any pain medicine today. No n/v.

## 2012-01-28 NOTE — Progress Notes (Signed)
Dr mody called and notified of patient increased discomfort. She ordered 30mg  im toradol

## 2012-07-04 ENCOUNTER — Inpatient Hospital Stay (HOSPITAL_COMMUNITY)
Admission: AD | Admit: 2012-07-04 | Discharge: 2012-07-04 | Disposition: A | Discharge status: Home / Self Care | Payer: 59 | Source: Ambulatory Visit | Attending: Obstetrics & Gynecology | Admitting: Obstetrics & Gynecology

## 2012-07-04 ENCOUNTER — Encounter (HOSPITAL_COMMUNITY): Payer: Self-pay | Admitting: *Deleted

## 2012-07-04 DIAGNOSIS — N760 Acute vaginitis: Secondary | ICD-10-CM | POA: Insufficient documentation

## 2012-07-04 DIAGNOSIS — A499 Bacterial infection, unspecified: Secondary | ICD-10-CM

## 2012-07-04 DIAGNOSIS — N949 Unspecified condition associated with female genital organs and menstrual cycle: Secondary | ICD-10-CM | POA: Insufficient documentation

## 2012-07-04 DIAGNOSIS — B9689 Other specified bacterial agents as the cause of diseases classified elsewhere: Secondary | ICD-10-CM | POA: Insufficient documentation

## 2012-07-04 LAB — URINALYSIS, ROUTINE W REFLEX MICROSCOPIC
Glucose, UA: NEGATIVE mg/dL
Leukocytes, UA: NEGATIVE
Protein, ur: NEGATIVE mg/dL
Specific Gravity, Urine: 1.02 (ref 1.005–1.030)
Urobilinogen, UA: 0.2 mg/dL (ref 0.0–1.0)

## 2012-07-04 LAB — POCT PREGNANCY, URINE: Preg Test, Ur: NEGATIVE

## 2012-07-04 MED ORDER — METRONIDAZOLE 500 MG PO TABS: 500.0000 mg | ORAL_TABLET | Freq: Two times a day (BID) | ORAL | Status: AC

## 2012-07-04 NOTE — MAU Provider Note (Signed)
History     CSN: [id number]  Arrival date & time 07/04/12  1333   None     Chief Complaint  Patient presents with  . Vaginal Discharge    HPI Jessica Gutierrez is a 21 y.o. female who presents to MAU for vaginal discharge. The discharge started 2 weeks ago. It is mixed with blood. No pain. LMP unsure due to implant for birth control. Ectopic pregnancy 2/13. Patient concerned about the discharge and smell. No history of STI's. The history was provided by the patient.  Past Medical History  Diagnosis Date  . Ectopic pregnancy, tubal     Past Surgical History  Procedure Date  . Salpingectomy     right ectopic pregancy 01/26/2011  . Dilation and curettage of uterus   . Dilation and curettage of uterus 01/27/2012    Procedure: DILATATION AND CURETTAGE;  Surgeon: Robley Fries, MD;  Location: [facility];  Service: Gynecology;  Laterality: N/A;  . Laparoscopy 01/27/2012    Procedure: LAPAROSCOPY OPERATIVE;  Surgeon: Robley Fries, MD;  Location: [facility];  Service: Gynecology;  Laterality: Right;    No family history on file.  History  Substance Use Topics  . Smoking status: Never Smoker   . Smokeless tobacco: Not on file  . Alcohol Use: No    OB History    Grav Para Term Preterm Abortions TAB SAB Ect Mult Living   2    2 1  1         Review of Systems  Constitutional: Negative for fever, chills, diaphoresis and fatigue.  HENT: Negative for ear pain, congestion, sore throat, facial swelling, neck pain, neck stiffness, dental problem and sinus pressure.   Eyes: Negative for photophobia, pain and discharge.  Respiratory: Negative for cough, chest tightness and wheezing.   Cardiovascular: Negative for chest pain and palpitations.  Gastrointestinal: Negative for nausea, vomiting, abdominal pain, diarrhea, constipation and abdominal distention.  Genitourinary: Positive for vaginal discharge. Negative for dysuria, frequency, flank pain and difficulty urinating. Vaginal bleeding:  spotting.  Musculoskeletal: Negative for myalgias, back pain and gait problem.  Skin: Negative for color change and rash.  Neurological: Negative for dizziness, speech difficulty, weakness, light-headedness, numbness and headaches.  Psychiatric/Behavioral: Negative for confusion and agitation. The patient is not nervous/anxious.     Allergies  Review of patient's allergies indicates no known allergies.  Home Medications  No current outpatient prescriptions on file.  BP 120/74  Pulse 83  Temp 99.1 F (37.3 C) (Oral)  Resp 16  Ht 5' (1.524 m)  Wt 126 lb (57.153 kg)  BMI 24.61 kg/m2  SpO2 100%  Physical Exam  Nursing note and vitals reviewed. Constitutional: She is oriented to person, place, and time. She appears well-developed and well-nourished. No distress.  HENT:  Head: Normocephalic.  Eyes: EOM are normal.  Neck: Neck supple.  Cardiovascular: Normal rate.   Pulmonary/Chest: Effort normal.  Abdominal: Soft. There is no tenderness.  Genitourinary:       External genitalia without lesions. White blood tinged discharge vaginal vault. No CMT, no adnexal tenderness. Uterus without palpable enlargement.  Musculoskeletal: Normal range of motion.  Neurological: She is alert and oriented to person, place, and time. No cranial nerve deficit.  Skin: Skin is warm and dry.  Psychiatric: She has a normal mood and affect. Her behavior is normal. Judgment and thought content normal.   Results for orders placed during the hospital encounter of 07/04/12 (from the past 24 hour(s))  URINALYSIS, ROUTINE W REFLEX  MICROSCOPIC     Status: Normal   Collection Time   07/04/12  1:45 PM      Component Value Range   Color, Urine YELLOW  YELLOW   APPearance CLEAR  CLEAR   Specific Gravity, Urine 1.020  1.005 - 1.030   pH 6.5  5.0 - 8.0   Glucose, UA NEGATIVE  NEGATIVE mg/dL   Hgb urine dipstick NEGATIVE  NEGATIVE   Bilirubin Urine NEGATIVE  NEGATIVE   Ketones, ur NEGATIVE  NEGATIVE mg/dL    Protein, ur NEGATIVE  NEGATIVE mg/dL   Urobilinogen, UA 0.2  0.0 - 1.0 mg/dL   Nitrite NEGATIVE  NEGATIVE   Leukocytes, UA NEGATIVE  NEGATIVE  POCT PREGNANCY, URINE     Status: Normal   Collection Time   07/04/12  2:17 PM      Component Value Range   Preg Test, Ur NEGATIVE  NEGATIVE  WET PREP, GENITAL     Status: Abnormal   Collection Time   07/04/12  2:40 PM      Component Value Range   Yeast Wet Prep HPF POC NONE SEEN  NONE SEEN   Trich, Wet Prep NONE SEEN  NONE SEEN   Clue Cells Wet Prep HPF POC MODERATE (*) NONE SEEN   WBC, Wet Prep HPF POC NONE SEEN  NONE SEEN   Assessment: 21 y.o. female with vaginal discharge    Bacterial vaginosis  Plan:  Cultures for GC and Chlamydia pending   Rx Flagyl   Follow up with [facility], return here as needed    ED Course  Procedures  MDM

## 2012-07-04 NOTE — MAU Provider Note (Signed)
Attestation of Attending Supervision of Advanced Practitioner (CNM/NP): Evaluation and management procedures were performed by the Advanced Practitioner under my supervision and collaboration.  I have reviewed the Advanced Practitioner's note and chart, and I agree with the management and plan.  Brianne Maina, M.D. 07/04/2012 5:23 PM  

## 2012-07-04 NOTE — MAU Note (Signed)
Patient states she has been having a vaginal discharge with slight odor for about 2 weeks.

## 2013-12-05 ENCOUNTER — Emergency Department (INDEPENDENT_AMBULATORY_CARE_PROVIDER_SITE_OTHER)
Admission: EM | Admit: 2013-12-05 | Discharge: 2013-12-05 | Disposition: A | Discharge status: Home / Self Care | Payer: 59 | Source: Home / Self Care

## 2013-12-05 ENCOUNTER — Encounter (HOSPITAL_COMMUNITY): Payer: Self-pay | Admitting: Emergency Medicine

## 2013-12-05 DIAGNOSIS — J111 Influenza due to unidentified influenza virus with other respiratory manifestations: Secondary | ICD-10-CM

## 2013-12-05 LAB — POCT RAPID STREP A: Streptococcus, Group A Screen (Direct): NEGATIVE

## 2013-12-05 MED ORDER — OSELTAMIVIR PHOSPHATE 75 MG PO CAPS: 75.0000 mg | ORAL_CAPSULE | Freq: Two times a day (BID) | ORAL | Status: AC

## 2013-12-05 MED ORDER — IPRATROPIUM BROMIDE 0.06 % NA SOLN: 2.0000 | Freq: Four times a day (QID) | NASAL | Status: AC

## 2013-12-05 NOTE — ED Provider Notes (Signed)
CSN: [id number]     Arrival date & time 12/05/13  1936 History   None    Chief Complaint  Patient presents with  . Sore Throat  . Nasal Congestion   (Consider location/radiation/quality/duration/timing/severity/associated sxs/prior Treatment) Patient is a 23 y.o. female presenting with pharyngitis. The history is provided by the patient.  Sore Throat This is a new problem. The current episode started yesterday (sudden onset of sx). The problem has not changed since onset.Pertinent negatives include no shortness of breath. The symptoms are aggravated by swallowing.    Past Medical History  Diagnosis Date  . Ectopic pregnancy, tubal    Past Surgical History  Procedure Laterality Date  . Salpingectomy      right ectopic pregancy 01/26/2011  . Dilation and curettage of uterus    . Dilation and curettage of uterus  01/27/2012    Procedure: DILATATION AND CURETTAGE;  Surgeon: Robley Fries, MD;  Location: [facility];  Service: Gynecology;  Laterality: N/A;  . Laparoscopy  01/27/2012    Procedure: LAPAROSCOPY OPERATIVE;  Surgeon: Robley Fries, MD;  Location: [facility];  Service: Gynecology;  Laterality: Right;   Family History  Problem Relation Age of Onset  . Other Neg Hx    History  Substance Use Topics  . Smoking status: Never Smoker   . Smokeless tobacco: Never Used  . Alcohol Use: No   OB History   Grav Para Term Preterm Abortions TAB SAB Ect Mult Living   2    2 1  1        Review of Systems  Constitutional: Positive for fever and chills.  HENT: Positive for sore throat. Negative for rhinorrhea.   Respiratory: Positive for cough. Negative for shortness of breath and wheezing.   Gastrointestinal: Negative.  Negative for nausea, vomiting and diarrhea.  Genitourinary: Negative.   Musculoskeletal: Positive for myalgias.    Allergies  Review of patient's allergies indicates no known allergies.  Home Medications   Current Outpatient Rx  Name  Route  Sig  Dispense  Refill   . ipratropium (ATROVENT) 0.06 % nasal spray   Nasal   Place 2 sprays into the nose 4 (four) times daily.   15 mL   1   . oseltamivir (TAMIFLU) 75 MG capsule   Oral   Take 1 capsule (75 mg total) by mouth every 12 (twelve) hours. Take all of medication.   10 capsule   0    BP 116/64  Pulse 98  Temp(Src) 101.4 F (38.6 C) (Oral)  Resp 16  SpO2 100% Physical Exam  Nursing note and vitals reviewed. Constitutional: She is oriented to person, place, and time. She appears well-developed and well-nourished. No distress.  HENT:  Head: Normocephalic.  Right Ear: External ear normal.  Left Ear: External ear normal.  Mouth/Throat: Oropharynx is clear and moist.  Eyes: Pupils are equal, round, and reactive to light.  Neck: Normal range of motion. Neck supple.  Cardiovascular: Regular rhythm and normal heart sounds.   Pulmonary/Chest: Effort normal and breath sounds normal.  Abdominal: Soft. Bowel sounds are normal.  Lymphadenopathy:    She has no cervical adenopathy.  Neurological: She is alert and oriented to person, place, and time.  Skin: Skin is warm and dry.    ED Course  Procedures (including critical care time) Labs Review Labs Reviewed  POCT RAPID STREP A ([facility] CARE ONLY)   Imaging Review No results found.  EKG Interpretation    Date/Time:  Ventricular Rate:    PR Interval:    QRS Duration:   QT Interval:    QTC Calculation:   R Axis:     Text Interpretation:              MDM   1. Influenza-like illness      Linna Hoff, MD 12/05/13 2021

## 2013-12-05 NOTE — ED Notes (Signed)
C/o severe sore throat and swelling. Body aches. Fever. Nasal congestion.  On set yesterday.  Denies n/v/d.  Last dose of tylenol was at 5 p.m.

## 2013-12-07 LAB — CULTURE, GROUP A STREP

## 2014-02-07 ENCOUNTER — Emergency Department (INDEPENDENT_AMBULATORY_CARE_PROVIDER_SITE_OTHER)
Admission: EM | Admit: 2014-02-07 | Discharge: 2014-02-07 | Disposition: A | Discharge status: Home / Self Care | Payer: 59 | Source: Home / Self Care | Attending: Emergency Medicine | Admitting: Emergency Medicine

## 2014-02-07 ENCOUNTER — Emergency Department (INDEPENDENT_AMBULATORY_CARE_PROVIDER_SITE_OTHER): Payer: 59

## 2014-02-07 ENCOUNTER — Encounter (HOSPITAL_COMMUNITY): Payer: Self-pay | Admitting: Emergency Medicine

## 2014-02-07 DIAGNOSIS — J209 Acute bronchitis, unspecified: Secondary | ICD-10-CM

## 2014-02-07 MED ORDER — HYDROCOD POLST-CHLORPHEN POLST 10-8 MG/5ML PO LQCR: 5.0000 mL | Freq: Two times a day (BID) | ORAL | Status: AC | PRN

## 2014-02-07 MED ORDER — AZITHROMYCIN 250 MG PO TABS: ORAL_TABLET | ORAL | Status: AC

## 2014-02-07 MED ORDER — PREDNISONE 20 MG PO TABS: 20.0000 mg | ORAL_TABLET | Freq: Two times a day (BID) | ORAL | Status: AC

## 2014-02-07 MED ORDER — ALBUTEROL SULFATE HFA 108 (90 BASE) MCG/ACT IN AERS: 2.0000 | INHALATION_SPRAY | Freq: Four times a day (QID) | RESPIRATORY_TRACT | Status: AC

## 2014-02-07 NOTE — ED Notes (Signed)
C/o 1 week duration of cough to point of vomiting ; NAD at present

## 2014-02-07 NOTE — Discharge Instructions (Signed)
Acute Bronchitis °Bronchitis is inflammation of the airways that extend from the windpipe into the lungs (bronchi). The inflammation often causes mucus to develop. This leads to a cough, which is the most common symptom of bronchitis.  °In acute bronchitis, the condition usually develops suddenly and goes away over time, usually in a couple weeks. Smoking, allergies, and asthma can make bronchitis worse. Repeated episodes of bronchitis may cause further lung problems.  °CAUSES °Acute bronchitis is most often caused by the same virus that causes a cold. The virus can spread from person to person (contagious).  °SIGNS AND SYMPTOMS  °· Cough.   °· Fever.   °· Coughing up mucus.   °· Body aches.   °· Chest congestion.   °· Chills.   °· Shortness of breath.   °· Sore throat.   °DIAGNOSIS  °Acute bronchitis is usually diagnosed through a physical exam. Tests, such as chest X-rays, are sometimes done to rule out other conditions.  °TREATMENT  °Acute bronchitis usually goes away in a couple weeks. Often times, no medical treatment is necessary. Medicines are sometimes given for relief of fever or cough. Antibiotics are usually not needed but may be prescribed in certain situations. In some cases, an inhaler may be recommended to help reduce shortness of breath and control the cough. A cool mist vaporizer may also be used to help thin bronchial secretions and make it easier to clear the chest.  °HOME CARE INSTRUCTIONS °· Get plenty of rest.   °· Drink enough fluids to keep your urine clear or pale yellow (unless you have a medical condition that requires fluid restriction). Increasing fluids may help thin your secretions and will prevent dehydration.   °· Only take over-the-counter or prescription medicines as directed by your health care provider.   °· Avoid smoking and secondhand smoke. Exposure to cigarette smoke or irritating chemicals will make bronchitis worse. If you are a smoker, consider using nicotine gum or skin  patches to help control withdrawal symptoms. Quitting smoking will help your lungs heal faster.   °· Reduce the chances of another bout of acute bronchitis by washing your hands frequently, avoiding people with cold symptoms, and trying not to touch your hands to your mouth, nose, or eyes.   °· Follow up with your health care provider as directed.   °SEEK MEDICAL CARE IF: °Your symptoms do not improve after 1 week of treatment.  °SEEK IMMEDIATE MEDICAL CARE IF: °· You develop an increased fever or chills.   °· You have chest pain.   °· You have severe shortness of breath. °· You have bloody sputum.   °· You develop dehydration. °· You develop fainting. °· You develop repeated vomiting. °· You develop a severe headache. °MAKE SURE YOU:  °· Understand these instructions. °· Will watch your condition. °· Will get help right away if you are not doing well or get worse. °Document Released: 01/21/2005 Document Revised: 08/16/2013 Document Reviewed: 06/06/2013 °ExitCare® Patient Information ©2014 [employer]. °How to Use an Inhaler °Proper inhaler technique is very important. Good technique ensures that the medicine reaches the lungs. Poor technique results in depositing the medicine on the tongue and back of the throat rather than in the airways. If you do not use the inhaler with good technique, the medicine will not help you. °STEPS TO FOLLOW IF USING AN INHALER WITHOUT AN EXTENSION TUBE °1. Remove the cap from the inhaler. °2. If you are using the inhaler for the first time, you will need to prime it. Shake the inhaler for 5 seconds and release four puffs into the air,   away from your face. Ask your health care provider or pharmacist if you have questions about priming your inhaler. °3. Shake the inhaler for 5 seconds before each breath in (inhalation). °4. Position the inhaler so that the top of the canister faces up. °5. Put your index finger on the top of the medicine canister. Your thumb supports the bottom of the  inhaler. °6. Open your mouth. °7. Either place the inhaler between your teeth and place your lips tightly around the mouthpiece, or hold the inhaler 1 2 inches away from your open mouth. If you are unsure of which technique to use, ask your health care provider. °8. Breathe out (exhale) normally and as completely as possible. °9. Press the canister down with your index finger to release the medicine. °10. At the same time as the canister is pressed, inhale deeply and slowly until your lungs are completely filled. This should take 4 6 seconds. Keep your tongue down. °11. Hold the medicine in your lungs for 5 10 seconds (10 seconds is best). This helps the medicine get into the small airways of your lungs. °12. Breathe out slowly, through pursed lips. Whistling is an example of pursed lips. °13. Wait at least 15 30 seconds between puffs. Continue with the above steps until you have taken the number of puffs your health care provider has ordered. Do not use the inhaler more than your health care provider tells you. °14. Replace the cap on the inhaler. °15. Follow the directions from your health care provider or the inhaler insert for cleaning the inhaler. °STEPS TO FOLLOW IF USING AN INHALER WITH AN EXTENSION (SPACER) °1. Remove the cap from the inhaler. °2. If you are using the inhaler for the first time, you will need to prime it. Shake the inhaler for 5 seconds and release four puffs into the air, away from your face. Ask your health care provider or pharmacist if you have questions about priming your inhaler. °3. Shake the inhaler for 5 seconds before each breath in (inhalation). °4. Place the open end of the spacer onto the mouthpiece of the inhaler. °5. Position the inhaler so that the top of the canister faces up and the spacer mouthpiece faces you. °6. Put your index finger on the top of the medicine canister. Your thumb supports the bottom of the inhaler and the spacer. °7. Breathe out (exhale) normally and as  completely as possible. °8. Immediately after exhaling, place the spacer between your teeth and into your mouth. Close your lips tightly around the spacer. °9. Press the canister down with your index finger to release the medicine. °10. At the same time as the canister is pressed, inhale deeply and slowly until your lungs are completely filled. This should take 4 6 seconds. Keep your tongue down and out of the way. °11. Hold the medicine in your lungs for 5 10 seconds (10 seconds is best). This helps the medicine get into the small airways of your lungs. Exhale. °12. Repeat inhaling deeply through the spacer mouthpiece. Again hold that breath for up to 10 seconds (10 seconds is best). Exhale slowly. If it is difficult to take this second deep breath through the spacer, breathe normally several times through the spacer. Remove the spacer from your mouth. °13. Wait at least 15 30 seconds between puffs. Continue with the above steps until you have taken the number of puffs your health care provider has ordered. Do not use the inhaler more than your health care   provider tells you. °14. Remove the spacer from the inhaler, and place the cap on the inhaler. °15. Follow the directions from your health care provider or the inhaler insert for cleaning the inhaler and spacer. °If you are using different kinds of inhalers, use your quick relief medicine to open the airways 10 15 minutes before using a steroid if instructed to do so by your health care provider. If you are unsure which inhalers to use and the order of using them, ask your health care provider, nurse, or respiratory therapist. °If you are using a steroid inhaler, always rinse your mouth with water after your last puff, then gargle and spit out the water. Do not swallow the water. °AVOID: °· Inhaling before or after starting the spray of medicine. It takes practice to coordinate your breathing with triggering the spray. °· Inhaling through the nose (rather than  the mouth) when triggering the spray. °HOW TO DETERMINE IF YOUR INHALER IS FULL OR NEARLY EMPTY °You cannot know when an inhaler is empty by shaking it. A few inhalers are now being made with dose counters. Ask your health care provider for a prescription that has a dose counter if you feel you need that extra help. If your inhaler does not have a counter, ask your health care provider to help you determine the date you need to refill your inhaler. Write the refill date on a calendar or your inhaler canister. Refill your inhaler 7 10 days before it runs out. Be sure to keep an adequate supply of medicine. This includes making sure it is not expired, and that you have a spare inhaler.  °SEEK MEDICAL CARE IF:  °· Your symptoms are only partially relieved with your inhaler. °· You are having trouble using your inhaler. °· You have some increase in phlegm. °SEEK IMMEDIATE MEDICAL CARE IF:  °· You feel little or no relief with your inhalers. You are still wheezing and are feeling shortness of breath or tightness in your chest or both. °· You have dizziness, headaches, or a fast heart rate. °· You have chills, fever, or night sweats. °· You have a noticeable increase in phlegm production, or there is blood in the phlegm. °MAKE SURE YOU:  °· Understand these instructions. °· Will watch your condition. °· Will get help right away if you are not doing well or get worse. °Document Released: 12/11/2000 Document Revised: 10/04/2013 Document Reviewed: 07/13/2013 °ExitCare® Patient Information ©2014 [employer]. ° °

## 2014-02-07 NOTE — ED Provider Notes (Signed)
Chief Complaint   Chief Complaint  Patient presents with  . Cough    History of Present Illness   Jessica Gutierrez is a 23 year old female who has had a one-week history of cough productive of yellow sputum, aching in her chest, posttussive vomiting, nasal congestion, rhinorrhea, headache, ear pain, abdominal pain, and sore throat. She denies any fever, chills, or wheezing and she has had no specific sick exposures. She's up-to-date on all immunizations.  Review of Systems   Other than as noted above, the patient denies any of the following symptoms: Systemic:  No fevers, chills, sweats, or myalgias. Eye:  No redness or discharge. ENT:  No ear pain, headache, nasal congestion, drainage, sinus pressure, or sore throat. Neck:  No neck pain, stiffness, or swollen glands. Lungs:  No cough, sputum production, hemoptysis, wheezing, chest tightness, shortness of breath or chest pain. GI:  No abdominal pain, nausea, vomiting or diarrhea.  PMFSH   Past medical history, family history, social history, meds, and allergies were reviewed.   Physical exam   Vital signs:  BP 100/67  Pulse 81  Temp(Src) 98.4 F (36.9 C) (Oral)  SpO2 100% General:  Alert and oriented.  In no distress.  Skin warm and dry. Eye:  No conjunctival injection or drainage. Lids were normal. ENT:  TMs and canals were normal, without erythema or inflammation.  Nasal mucosa was clear and uncongested, without drainage.  Mucous membranes were moist.  Pharynx was clear with no exudate or drainage.  There were no oral ulcerations or lesions. Neck:  Supple, no adenopathy, tenderness or mass. Lungs:  No respiratory distress.  Lungs were clear to auscultation, without wheezes, rales or rhonchi.  Breath sounds were clear and equal bilaterally.  Heart:  Regular rhythm, without gallops, murmers or rubs. Skin:  Clear, warm, and dry, without rash or lesions.  Radiology   Dg Chest 2 View  02/07/2014   CLINICAL DATA:  Cough  congestion chest pain for 1 week  EXAM: CHEST  2 VIEW  COMPARISON:  None.  FINDINGS: The heart size and mediastinal contours are within normal limits. Both lungs are clear. The visualized skeletal structures are unremarkable. Right nipple piercing noted  IMPRESSION: No active cardiopulmonary disease.   Electronically Signed   By: Esperanza Heiraymond  Rubner M.D.   On: 02/07/2014 20:09   Assessment     The encounter diagnosis was Acute bronchitis.  Plan    1.  Meds:  The following meds were prescribed:   Discharge Medication List as of 02/07/2014  8:25 PM    START taking these medications   Details  albuterol (PROVENTIL HFA;VENTOLIN HFA) 108 (90 BASE) MCG/ACT inhaler Inhale 2 puffs into the lungs 4 (four) times daily., Starting 02/07/2014, Until Discontinued, Normal    azithromycin (ZITHROMAX Z-PAK) 250 MG tablet Take as directed., Normal    chlorpheniramine-HYDROcodone (TUSSIONEX) 10-8 MG/5ML LQCR Take 5 mLs by mouth every 12 (twelve) hours as needed for cough., Starting 02/07/2014, Until Discontinued, Normal    predniSONE (DELTASONE) 20 MG tablet Take 1 tablet (20 mg total) by mouth 2 (two) times daily., Starting 02/07/2014, Until Discontinued, Normal        2.  Patient Education/Counseling:  The patient was given appropriate handouts, self care instructions, and instructed in symptomatic relief.  Instructed to get extra fluids, rest, and use a cool mist vaporizer.    3.  Follow up:  The patient was told to follow up here if no better in 3 to 4 days, or sooner if  becoming worse in any way, and given some red flag symptoms such as increasing fever, difficulty breathing, chest pain, or persistent vomiting which would prompt immediate return.  Follow up here as needed.      Reuben Likes, MD 02/07/14 2221

## 2014-03-05 IMAGING — CR DG CHEST 2V
2 series · 2 of 2 positions shown · non-contrast
Comparison: None.

CLINICAL DATA: Cough congestion chest pain for 1 week

EXAM:
CHEST  2 VIEW

[view not recorded (1 of 2)]
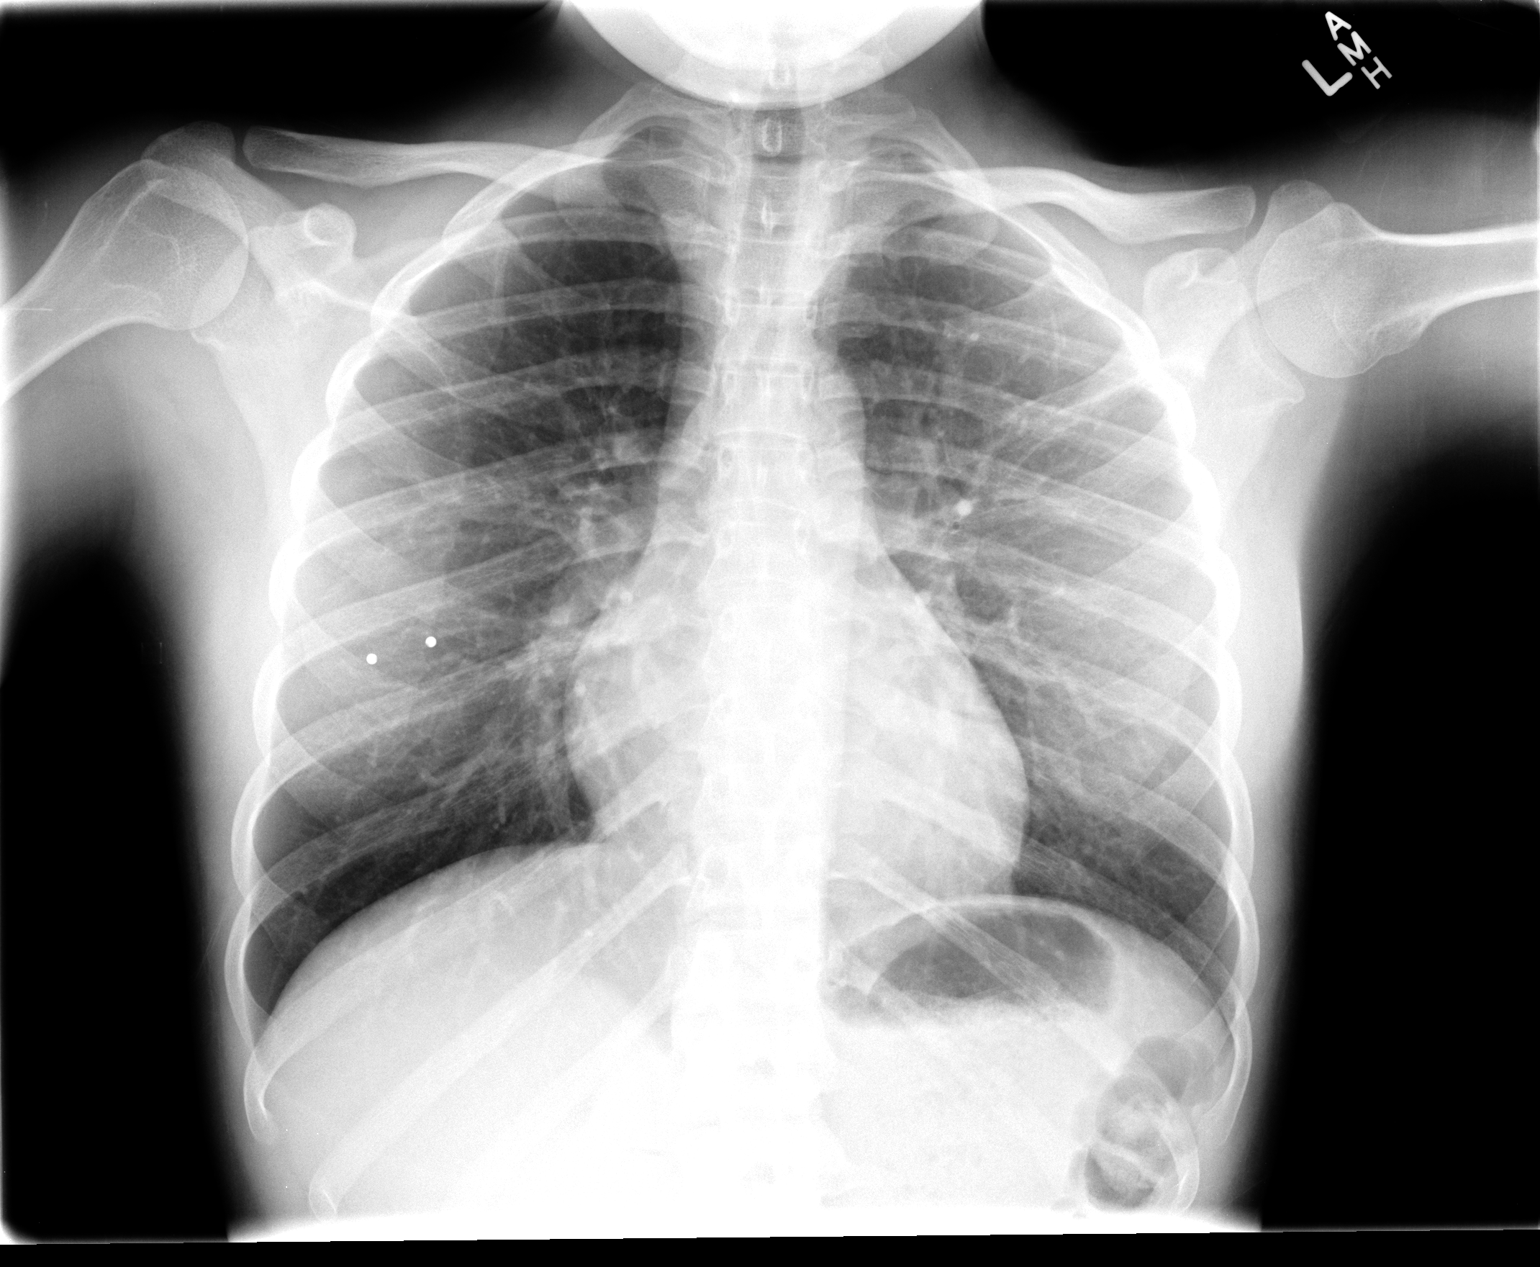

[view not recorded (2 of 2)]
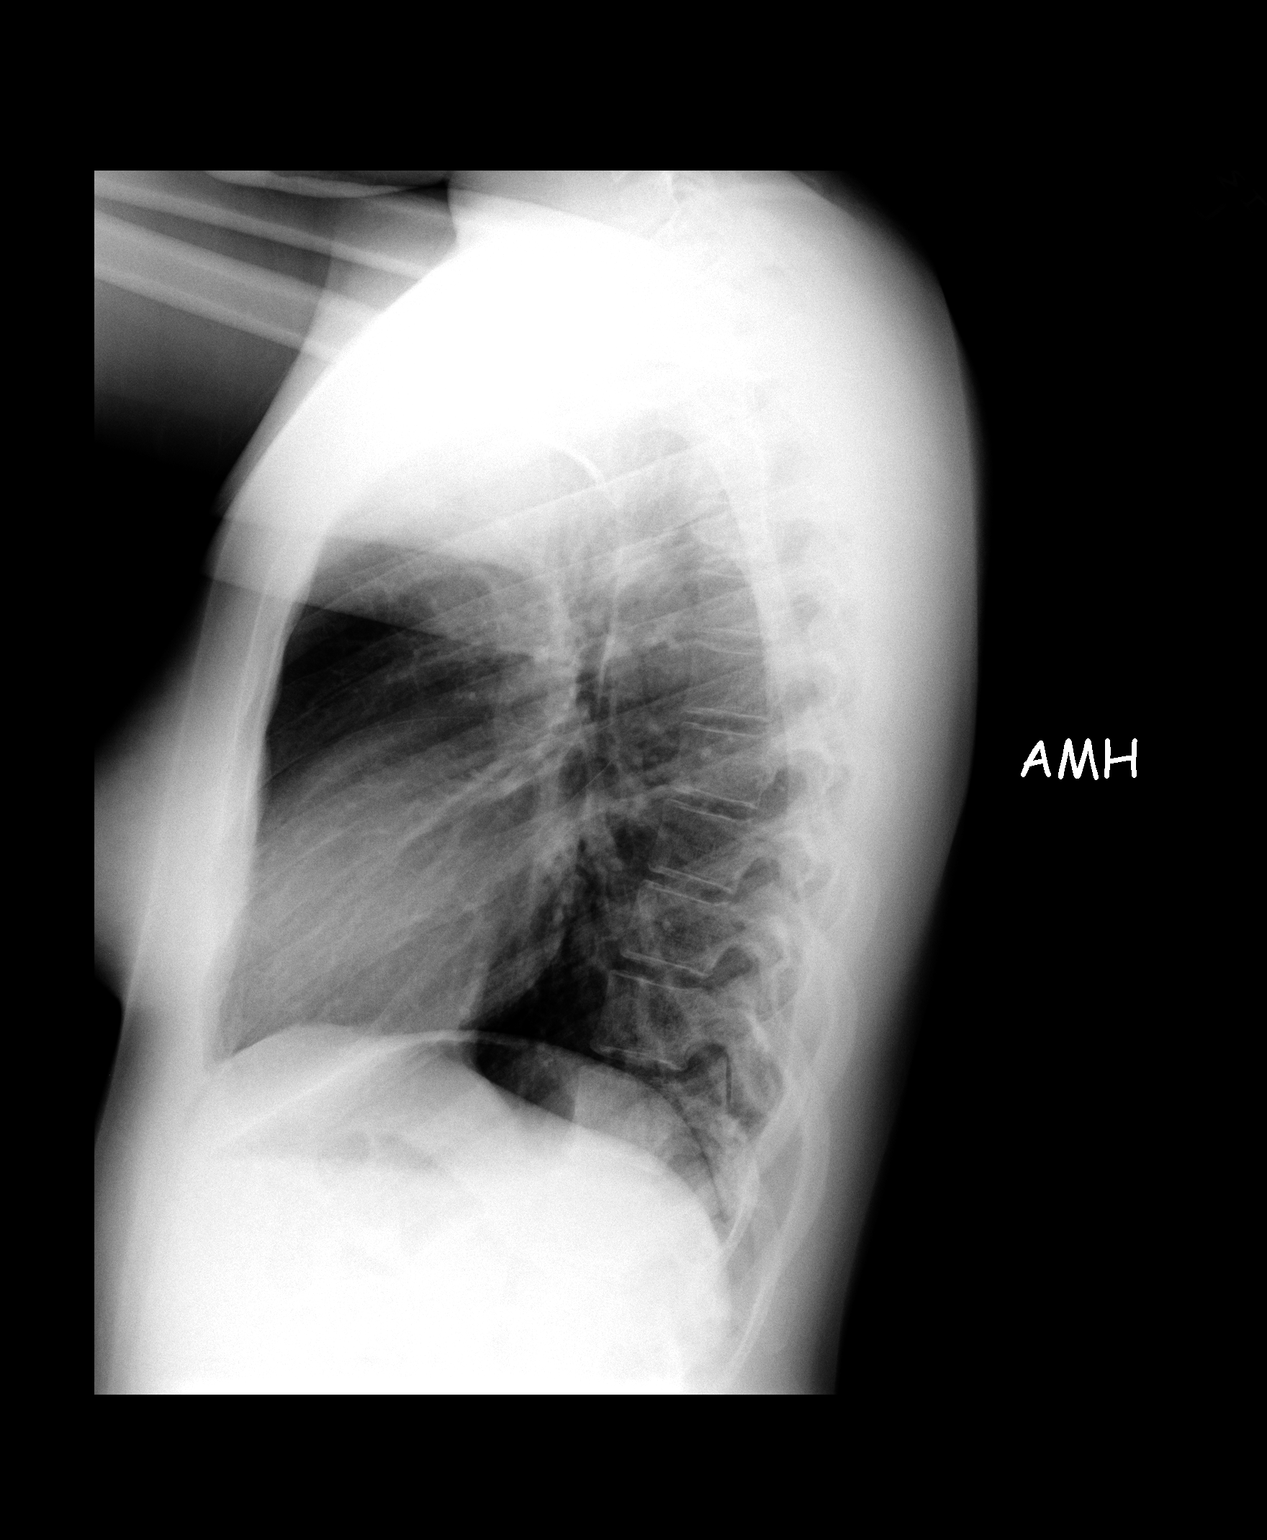

[2 of 2 positions shown; findings below may reference images not displayed]

FINDINGS: The heart size and mediastinal contours are within normal limits.
Both lungs are clear. The visualized skeletal structures are
unremarkable. Right nipple piercing noted
IMPRESSION: No active cardiopulmonary disease.

## 2014-10-29 ENCOUNTER — Encounter (HOSPITAL_COMMUNITY): Payer: Self-pay | Admitting: Emergency Medicine

## 2014-11-03 ENCOUNTER — Encounter (HOSPITAL_COMMUNITY): Payer: Self-pay | Admitting: Emergency Medicine

## 2014-11-03 ENCOUNTER — Emergency Department (HOSPITAL_COMMUNITY)
Admission: EM | Admit: 2014-11-03 | Discharge: 2014-11-03 | Disposition: A | Discharge status: Home / Self Care | Payer: 59 | Attending: Emergency Medicine | Admitting: Emergency Medicine

## 2014-11-03 DIAGNOSIS — Z7952 Long term (current) use of systemic steroids: Secondary | ICD-10-CM | POA: Diagnosis not present

## 2014-11-03 DIAGNOSIS — Z79899 Other long term (current) drug therapy: Secondary | ICD-10-CM | POA: Insufficient documentation

## 2014-11-03 DIAGNOSIS — S0501XA Injury of conjunctiva and corneal abrasion without foreign body, right eye, initial encounter: Secondary | ICD-10-CM | POA: Diagnosis not present

## 2014-11-03 DIAGNOSIS — S0591XA Unspecified injury of right eye and orbit, initial encounter: Secondary | ICD-10-CM | POA: Diagnosis present

## 2014-11-03 MED ORDER — FLUORESCEIN SODIUM 1 MG OP STRP
1.0000 | ORAL_STRIP | Freq: Once | OPHTHALMIC | Status: AC
  Administered 2014-11-03: 1 via OPHTHALMIC
  Filled 2014-11-03: qty 1

## 2014-11-03 MED ORDER — TOBRAMYCIN 0.3 % OP SOLN: 2.0000 [drp] | OPHTHALMIC | Status: AC

## 2014-11-03 MED ORDER — ACETAMINOPHEN 325 MG PO TABS
650.0000 mg | ORAL_TABLET | ORAL | Status: DC | PRN
  Administered 2014-11-03: 650 mg via ORAL
  Filled 2014-11-03: qty 2

## 2014-11-03 MED ORDER — TETRACAINE HCL 0.5 % OP SOLN
1.0000 [drp] | Freq: Once | OPHTHALMIC | Status: AC
  Administered 2014-11-03: 1 [drp] via OPHTHALMIC
  Filled 2014-11-03: qty 2

## 2014-11-03 NOTE — Discharge Instructions (Signed)
Corneal Abrasion °The cornea is the clear covering at the front and center of the eye. When looking at the colored portion of the eye (iris), you are looking through the cornea. This very thin tissue is made up of many layers. The surface layer is a single layer of cells (corneal epithelium) and is one of the most sensitive tissues in the body. If a scratch or injury causes the corneal epithelium to come off, it is called a corneal abrasion. If the injury extends to the tissues below the epithelium, the condition is called a corneal ulcer. °CAUSES  °· Scratches. °· Trauma. °· Foreign body in the eye. °Some people have recurrences of abrasions in the area of the original injury even after it has healed (recurrent erosion syndrome). Recurrent erosion syndrome generally improves and goes away with time. °SYMPTOMS  °· Eye pain. °· Difficulty or inability to keep the injured eye open. °· The eye becomes very sensitive to light. °· Recurrent erosions tend to happen suddenly, first thing in the morning, usually after waking up and opening the eye. °DIAGNOSIS  °Your health care provider can diagnose a corneal abrasion during an eye exam. Dye is usually placed in the eye using a drop or a small paper strip moistened by your tears. When the eye is examined with a special light, the abrasion shows up clearly because of the dye. °TREATMENT  °· Small abrasions may be treated with antibiotic drops or ointment alone. °· A pressure patch may be put over the eye. If this is done, follow your doctor's instructions for when to remove the patch. Do not drive or use machines while the eye patch is on. Judging distances is hard to do with a patch on. °If the abrasion becomes infected and spreads to the deeper tissues of the cornea, a corneal ulcer can result. This is serious because it can cause corneal scarring. Corneal scars interfere with light passing through the cornea and cause a loss of vision in the involved eye. °HOME CARE  INSTRUCTIONS °· Use medicine or ointment as directed. Only take over-the-counter or prescription medicines for pain, discomfort, or fever as directed by your health care provider. °· Do not drive or operate machinery if your eye is patched. Your ability to judge distances is impaired. °· If your health care provider has given you a follow-up appointment, it is very important to keep that appointment. Not keeping the appointment could result in a severe eye infection or permanent loss of vision. If there is any problem keeping the appointment, let your health care provider know. °SEEK MEDICAL CARE IF:  °· You have pain, light sensitivity, and a scratchy feeling in one eye or both eyes. °· Your pressure patch keeps loosening up, and you can blink your eye under the patch after treatment. °· Any kind of discharge develops from the eye after treatment or if the lids stick together in the morning. °· You have the same symptoms in the morning as you did with the original abrasion days, weeks, or months after the abrasion healed. °MAKE SURE YOU:  °· Understand these instructions. °· Will watch your condition. °· Will get help right away if you are not doing well or get worse. °Document Released: 12/11/2000 Document Revised: 12/19/2013 Document Reviewed: 08/21/2013 °Exit[employer]® Patient Information ©2015 [employer]. This information is not intended to replace advice given to you by your health care provider. Make sure you discuss any questions you have with your health care provider. ° °
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
